# Patient Record
Sex: Male | Born: 1970 | Race: White | Hispanic: No | Marital: Married | Smoking: Former smoker
Health system: Southern US, Community
[De-identification: ages and names within clinical notes are randomized; demographics above are authoritative.]

## PROBLEM LIST (undated history)

## (undated) DIAGNOSIS — I10 Essential (primary) hypertension: Secondary | ICD-10-CM

## (undated) DIAGNOSIS — I219 Acute myocardial infarction, unspecified: Secondary | ICD-10-CM

## (undated) HISTORY — PX: CARDIAC SURGERY: SHX584

---

## 2019-08-21 ENCOUNTER — Other Ambulatory Visit: Payer: Self-pay

## 2019-08-21 ENCOUNTER — Emergency Department
Admission: EM | Admit: 2019-08-21 | Discharge: 2019-08-22 | Disposition: A | Discharge status: Home / Self Care | Payer: Federal, State, Local not specified - PPO | Attending: Emergency Medicine | Admitting: Emergency Medicine

## 2019-08-21 ENCOUNTER — Emergency Department: Payer: Federal, State, Local not specified - PPO

## 2019-08-21 ENCOUNTER — Encounter: Payer: Self-pay | Admitting: Emergency Medicine

## 2019-08-21 DIAGNOSIS — W010XXA Fall on same level from slipping, tripping and stumbling without subsequent striking against object, initial encounter: Secondary | ICD-10-CM | POA: Diagnosis not present

## 2019-08-21 DIAGNOSIS — Y9369 Activity, other involving other sports and athletics played as a team or group: Secondary | ICD-10-CM | POA: Diagnosis not present

## 2019-08-21 DIAGNOSIS — Z87891 Personal history of nicotine dependence: Secondary | ICD-10-CM | POA: Diagnosis not present

## 2019-08-21 DIAGNOSIS — Z79899 Other long term (current) drug therapy: Secondary | ICD-10-CM | POA: Diagnosis not present

## 2019-08-21 DIAGNOSIS — Y999 Unspecified external cause status: Secondary | ICD-10-CM | POA: Diagnosis not present

## 2019-08-21 DIAGNOSIS — Y929 Unspecified place or not applicable: Secondary | ICD-10-CM | POA: Insufficient documentation

## 2019-08-21 DIAGNOSIS — S99912A Unspecified injury of left ankle, initial encounter: Secondary | ICD-10-CM | POA: Diagnosis present

## 2019-08-21 DIAGNOSIS — I10 Essential (primary) hypertension: Secondary | ICD-10-CM | POA: Diagnosis not present

## 2019-08-21 DIAGNOSIS — S82832A Other fracture of upper and lower end of left fibula, initial encounter for closed fracture: Secondary | ICD-10-CM | POA: Diagnosis not present

## 2019-08-21 HISTORY — DX: Essential (primary) hypertension: I10

## 2019-08-21 HISTORY — DX: Acute myocardial infarction, unspecified: I21.9

## 2019-08-21 MED ORDER — HYDROCODONE-ACETAMINOPHEN 5-325 MG PO TABS: 1.0000 | ORAL_TABLET | ORAL | 0 refills | Status: AC | PRN

## 2019-08-21 MED ORDER — MELOXICAM 7.5 MG PO TABS
15.0000 mg | ORAL_TABLET | Freq: Once | ORAL | Status: DC
  Filled 2019-08-21: qty 2

## 2019-08-21 MED ORDER — MELOXICAM 15 MG PO TABS: 15.0000 mg | ORAL_TABLET | Freq: Every day | ORAL | 0 refills | Status: AC

## 2019-08-21 NOTE — ED Triage Notes (Signed)
Patient states that he tripped and fell. He states that he felt his left ankle pop twice. Patient with pain and swelling to left ankle.

## 2019-08-21 NOTE — ED Provider Notes (Signed)
[facility] Emergency Department Provider Note  ____________________________________________  Time seen: Approximately 11:24 PM  I have reviewed the triage vital signs and the nursing notes.   HISTORY  Chief Complaint Ankle Pain    HPI Devon Tran is a 49 y.o. male who presents the emergency department for evaluation of ankle pain and injury.  Patient states that he was playing ping-pong in his socks, went to return a hit when he awkwardly moves his ankle, felt pops and had sudden pain and swelling to the outside of his ankle.  Patient reports that he is unable to bear weight at this time.  No other injury or complaint.  No history of previous ankle injuries or surgeries.         Past Medical History:  Diagnosis Date  . Hypertension   . MI (myocardial infarction) ([place])     There are no problems to display for this patient.   Past Surgical History:  Procedure Laterality Date  . CARDIAC SURGERY     cardiac stents    Prior to Admission medications   Medication Sig Start Date End Date Taking? Authorizing Provider  HYDROcodone-acetaminophen (NORCO/VICODIN) 5-325 MG tablet Take 1 tablet by mouth every 4 (four) hours as needed for moderate pain. 08/21/19   Cuthriell, Charline Bills, PA-C  meloxicam (MOBIC) 15 MG tablet Take 1 tablet (15 mg total) by mouth daily. 08/21/19   Cuthriell, Charline Bills, PA-C    Allergies Patient has no known allergies.  No family history on file.  Social History Social History   Tobacco Use  . Smoking status: Former Research scientist (life sciences)  . Smokeless tobacco: Never Used  Substance Use Topics  . Alcohol use: Yes  . Drug use: Never     Review of Systems  Constitutional: No fever/chills Eyes: No visual changes. No discharge ENT: No upper respiratory complaints. Cardiovascular: no chest pain. Respiratory: no cough. No SOB. Gastrointestinal: No abdominal pain.  No nausea, no vomiting.  No diarrhea.  No constipation. Musculoskeletal:  Positive for left ankle pain and injury. Skin: Negative for rash, abrasions, lacerations, ecchymosis. Neurological: Negative for headaches, focal weakness or numbness. 10-point ROS otherwise negative.  ____________________________________________   PHYSICAL EXAM:  VITAL SIGNS: ED Triage Vitals [08/21/19 2220]  Enc Vitals Group     BP 116/76     Pulse Rate 85     Resp 18     Temp 98.3 F (36.8 C)     Temp Source Oral     SpO2 94 %     Weight 245 lb (111.1 kg)     Height 5\' 8"  (1.727 m)     Head Circumference      Peak Flow      Pain Score 10     Pain Loc      Pain Edu?      Excl. in [place]?      Constitutional: Alert and oriented. Well appearing and in no acute distress. Eyes: Conjunctivae are normal. PERRL. EOMI. Head: Atraumatic. Neck: No stridor.    Cardiovascular: Normal rate, regular rhythm. Normal S1 and S2.  Good peripheral circulation. Respiratory: Normal respiratory effort without tachypnea or retractions. Lungs CTAB. Good air entry to the bases with no decreased or absent breath sounds. Musculoskeletal: Full range of motion to all extremities. No gross deformities appreciated.  Positive for edema to the lateral aspect of the ankle over the lateral malleolus.  No gross deformity.  Limited range of motion due to pain.  Patient is tender to  palpation over the lateral malleolus with no palpable abnormality.  No other significant areas of tenderness on palpation over the foot.  Dorsalis pedis pulse and sensation intact all digits. Neurologic:  Normal speech and language. No gross focal neurologic deficits are appreciated.  Skin:  Skin is warm, dry and intact. No rash noted. Psychiatric: Mood and affect are normal. Speech and behavior are normal. Patient exhibits appropriate insight and judgement.   ____________________________________________   LABS (all labs ordered are listed, but only abnormal results are displayed)  Labs Reviewed - No data to  display ____________________________________________  EKG   ____________________________________________  RADIOLOGY I personally viewed and evaluated these images as part of my medical decision making, as well as reviewing the written report by the radiologist.  DG Ankle Complete Left  Result Date: 08/21/2019 CLINICAL DATA:  49 year old male with left ankle pain and swelling. EXAM: LEFT ANKLE COMPLETE - 3+ VIEW COMPARISON:  None. FINDINGS: There is a nondisplaced fracture of the lateral malleolus. There is a minimally displaced cortical avulsion fracture of the tip of the medial malleolus. There is no dislocation. The ankle mortise is intact. There is soft tissue swelling of the ankle. No radiopaque foreign object or soft tissue gas. IMPRESSION: Fractures of the lateral malleolus and tip of the medial malleolus. No dislocation. Electronically Signed   By: Elgie Collard M.D.   On: 08/21/2019 22:46    ____________________________________________    PROCEDURES  Procedure(s) performed:    Procedures    Medications  meloxicam (MOBIC) tablet 15 mg (has no administration in time range)     ____________________________________________   INITIAL IMPRESSION / ASSESSMENT AND PLAN / ED COURSE  Pertinent labs & imaging results that were available during my care of the patient were reviewed by me and considered in my medical decision making (see chart for details).  Review of the [place] CSRS was performed in accordance of the [facility] prior to dispensing any controlled drugs.           Patient's diagnosis is consistent with lateral malleolus fracture.  Patient presented to the emergency department complaining of pain, swelling, injury to the lateral aspect of the ankle.  X-ray revealed nondisplaced distal fibular fracture.  Patient will be placed in a walking boot.  Anti-inflammatories and pain medication will be prescribed.  Follow-up with orthopedics.. Patient is given ED precautions  to return to the ED for any worsening or new symptoms.     ____________________________________________  FINAL CLINICAL IMPRESSION(S) / ED DIAGNOSES  Final diagnoses:  Other closed fracture of distal end of left fibula, initial encounter      NEW MEDICATIONS STARTED DURING THIS VISIT:  ED Discharge Orders         Ordered    meloxicam (MOBIC) 15 MG tablet  Daily     08/21/19 2356    HYDROcodone-acetaminophen (NORCO/VICODIN) 5-325 MG tablet  Every 4 hours PRN     08/21/19 2356              This chart was dictated using voice recognition software/Dragon. Despite best efforts to proofread, errors can occur which can change the meaning. Any change was purely unintentional.    Racheal Patches, PA-C 08/21/19 2357    Chesley Noon, MD 08/22/19 [phone]

## 2019-08-21 NOTE — ED Notes (Signed)
Pt assisted to restroom.  

## 2021-02-15 IMAGING — CR DG ANKLE COMPLETE 3+V*L*
3 series · 3 of 3 positions shown · non-contrast
Comparison: None.

CLINICAL DATA: 49-year-old male with left ankle pain and swelling.

EXAM:
LEFT ANKLE COMPLETE - 3+ VIEW

[ankle ap]
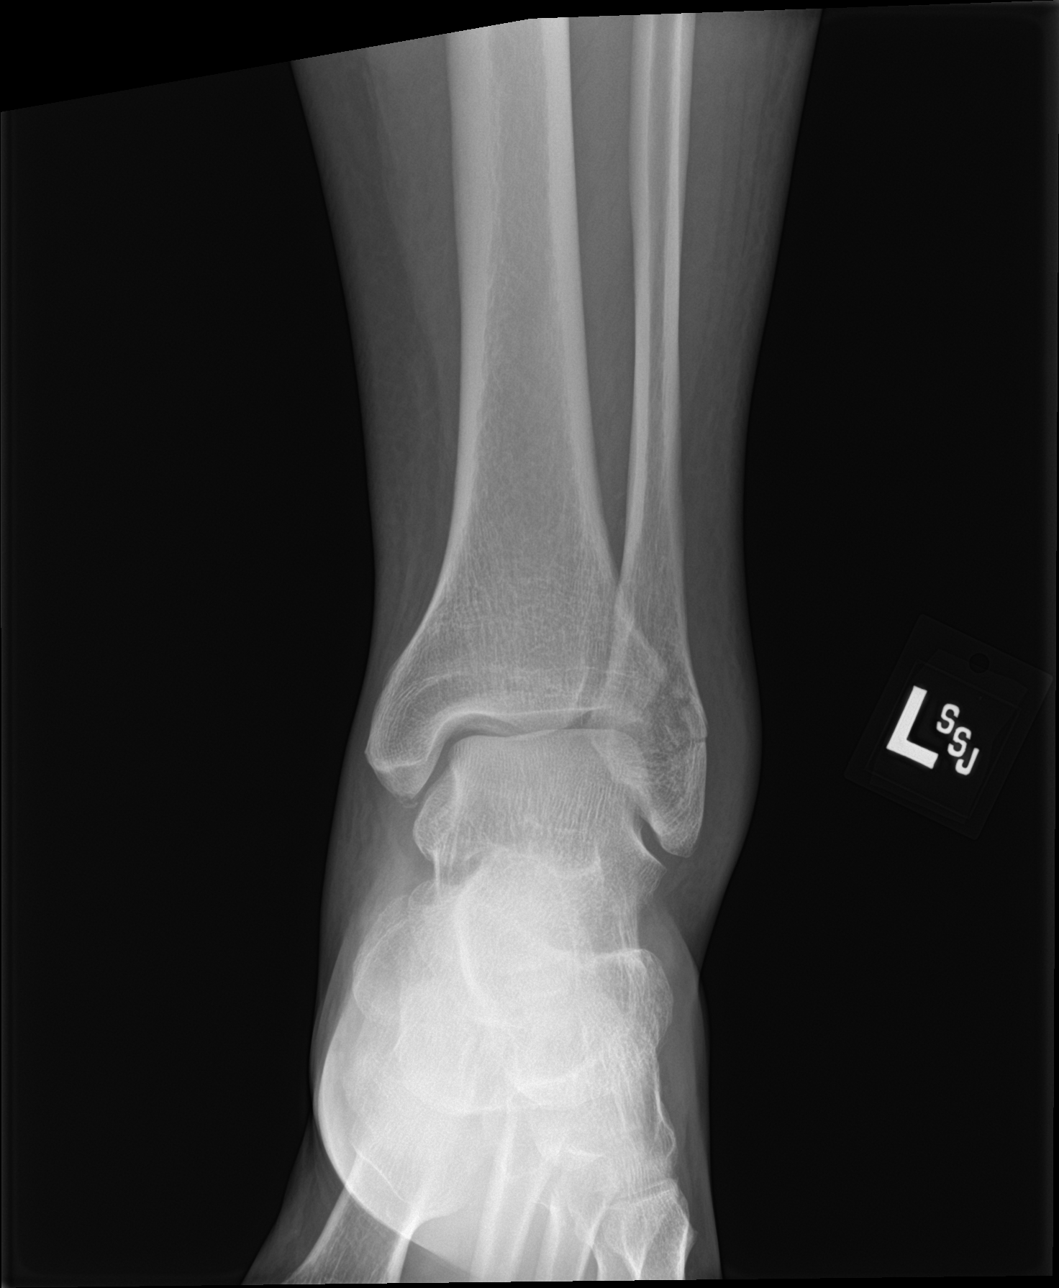

[ankle obl]
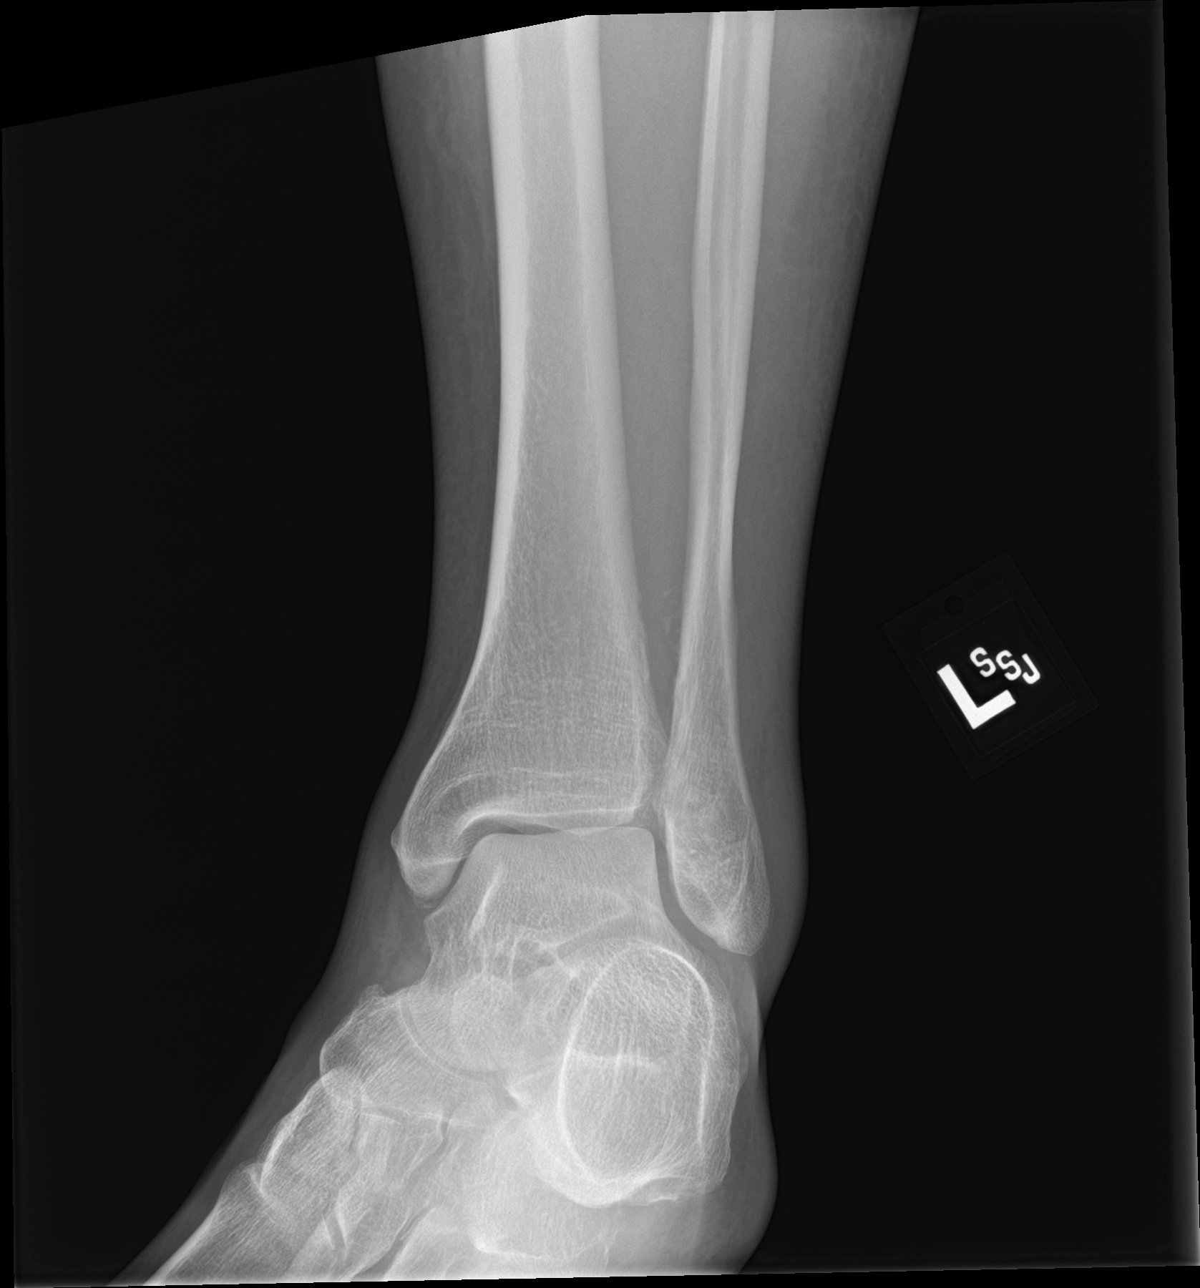

[ankle lat]
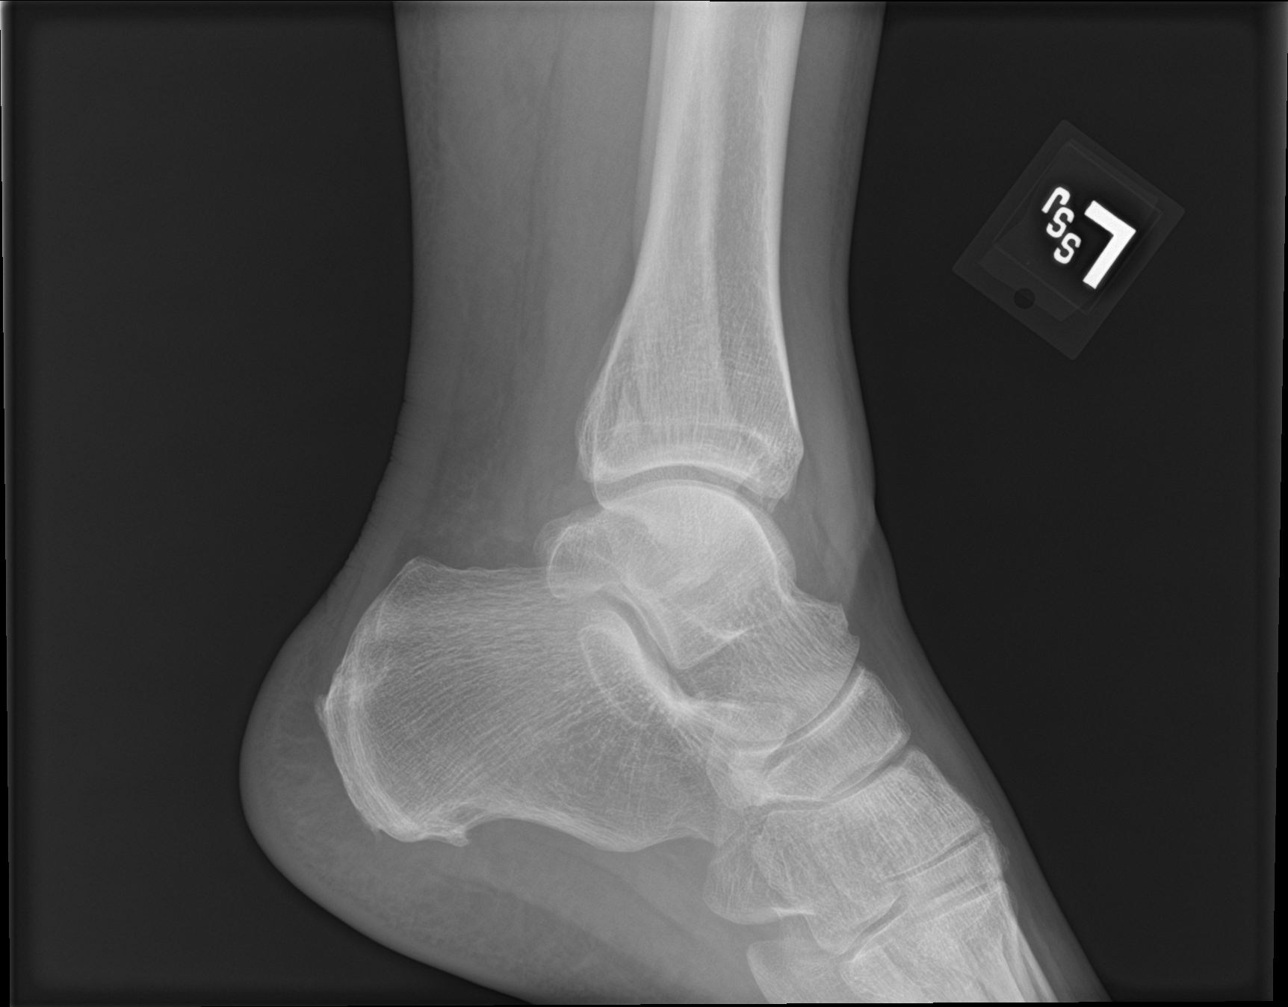

[3 of 3 positions shown; findings below may reference images not displayed]

FINDINGS: There is a nondisplaced fracture of the lateral malleolus. There is
a minimally displaced cortical avulsion fracture of the tip of the
medial malleolus. There is no dislocation. The ankle mortise is
intact. There is soft tissue swelling of the ankle. No radiopaque
foreign object or soft tissue gas.
IMPRESSION: Fractures of the lateral malleolus and tip of the medial malleolus.
No dislocation.
